# Patient Record
Sex: Female | Born: 1965 | Hispanic: Yes | Marital: Single | Smoking: Never smoker
Health system: Southern US, Community
[De-identification: ages and names within clinical notes are randomized; demographics above are authoritative.]

## PROBLEM LIST (undated history)

## (undated) DIAGNOSIS — R011 Cardiac murmur, unspecified: Secondary | ICD-10-CM

---

## 2004-10-13 ENCOUNTER — Emergency Department: Payer: Self-pay | Admitting: Emergency Medicine

## 2014-12-30 ENCOUNTER — Encounter: Payer: Self-pay | Admitting: *Deleted

## 2014-12-30 DIAGNOSIS — R42 Dizziness and giddiness: Secondary | ICD-10-CM | POA: Insufficient documentation

## 2014-12-30 DIAGNOSIS — R197 Diarrhea, unspecified: Secondary | ICD-10-CM | POA: Insufficient documentation

## 2014-12-30 DIAGNOSIS — R0602 Shortness of breath: Secondary | ICD-10-CM | POA: Insufficient documentation

## 2014-12-30 DIAGNOSIS — R1013 Epigastric pain: Secondary | ICD-10-CM | POA: Insufficient documentation

## 2014-12-30 DIAGNOSIS — R112 Nausea with vomiting, unspecified: Secondary | ICD-10-CM | POA: Insufficient documentation

## 2014-12-30 MED ORDER — ONDANSETRON 4 MG PO TBDP
4.0000 mg | ORAL_TABLET | Freq: Once | ORAL | Status: AC | PRN
  Administered 2014-12-31: 4 mg via ORAL
  Filled 2014-12-30: qty 1

## 2014-12-30 NOTE — ED Notes (Signed)
Pt c/o nausea early this morning. Pt states abdominal pain started at 1500. Pt states vomiting starting after pain and she has vomiting too many times to count. Pt states diarrhea started at 2230. Pt states abdominal pain worsening over time. Pt c/o dysuria starting today. Pt states unable to tolerate any PO.

## 2014-12-31 ENCOUNTER — Other Ambulatory Visit: Payer: Self-pay

## 2014-12-31 ENCOUNTER — Emergency Department
Admission: EM | Admit: 2014-12-31 | Discharge: 2014-12-31 | Disposition: A | Discharge status: Home / Self Care | Payer: Self-pay | Attending: Emergency Medicine | Admitting: Emergency Medicine

## 2014-12-31 DIAGNOSIS — R197 Diarrhea, unspecified: Secondary | ICD-10-CM

## 2014-12-31 DIAGNOSIS — R1013 Epigastric pain: Secondary | ICD-10-CM

## 2014-12-31 DIAGNOSIS — R112 Nausea with vomiting, unspecified: Secondary | ICD-10-CM

## 2014-12-31 HISTORY — DX: Cardiac murmur, unspecified: R01.1

## 2014-12-31 LAB — URINALYSIS COMPLETE WITH MICROSCOPIC (ARMC ONLY)
Bilirubin Urine: NEGATIVE
Glucose, UA: NEGATIVE mg/dL
HGB URINE DIPSTICK: NEGATIVE
Leukocytes, UA: NEGATIVE
Nitrite: NEGATIVE
PH: 5 (ref 5.0–8.0)
Protein, ur: 30 mg/dL — AB
Specific Gravity, Urine: 1.03 (ref 1.005–1.030)

## 2014-12-31 LAB — COMPREHENSIVE METABOLIC PANEL
ALT: 26 U/L (ref 14–54)
AST: 26 U/L (ref 15–41)
Albumin: 4.7 g/dL (ref 3.5–5.0)
Alkaline Phosphatase: 73 U/L (ref 38–126)
Anion gap: 10 (ref 5–15)
BUN: 14 mg/dL (ref 6–20)
CALCIUM: 9.7 mg/dL (ref 8.9–10.3)
CO2: 27 mmol/L (ref 22–32)
CREATININE: 0.62 mg/dL (ref 0.44–1.00)
Chloride: 102 mmol/L (ref 101–111)
GFR calc Af Amer: >60 mL/min (ref >60)
Glucose, Bld: 142 mg/dL — ABNORMAL HIGH (ref 65–99)
POTASSIUM: 4.2 mmol/L (ref 3.5–5.1)
Sodium: 139 mmol/L (ref 135–145)
TOTAL PROTEIN: 8.8 g/dL — AB (ref 6.5–8.1)
Total Bilirubin: 0.4 mg/dL (ref 0.3–1.2)

## 2014-12-31 LAB — LIPASE, BLOOD: Lipase: 21 U/L — ABNORMAL LOW (ref 22–51)

## 2014-12-31 LAB — CBC
HEMATOCRIT: 44.9 % (ref 35.0–47.0)
HEMOGLOBIN: 15.2 g/dL (ref 12.0–16.0)
MCH: 29.3 pg (ref 26.0–34.0)
MCHC: 33.8 g/dL (ref 32.0–36.0)
MCV: 86.9 fL (ref 80.0–100.0)
PLATELETS: 264 10*3/uL (ref 150–440)
RBC: 5.17 MIL/uL (ref 3.80–5.20)
RDW: 14 % (ref 11.5–14.5)
WBC: 16.6 10*3/uL — AB (ref 3.6–11.0)

## 2014-12-31 LAB — TROPONIN I

## 2014-12-31 MED ORDER — SODIUM CHLORIDE 0.9 % IV SOLN
1000.0000 mL | Freq: Once | INTRAVENOUS | Status: AC
  Administered 2014-12-31: 1000 mL via INTRAVENOUS

## 2014-12-31 MED ORDER — METOCLOPRAMIDE HCL 10 MG PO TABS: 10.0000 mg | ORAL_TABLET | Freq: Three times a day (TID) | ORAL | Status: AC

## 2014-12-31 NOTE — ED Provider Notes (Signed)
[facility] Emergency Department Provider Note  ____________________________________________  Time seen: Approximately 510 AM  I have reviewed the triage vital signs and the nursing notes.   HISTORY  Chief Complaint Abdominal Pain and Emesis    HPI Leah Farrell is a 49 y.o. female who comes in with abdominal pain and vomiting. The patient reports that initially she felt nauseous then started having vomiting and then started having diarrhea. The patient reports that this started this morning with the nausea and the dizziness. She reports that after 4 she started vomiting and then had diarrhea. She reports that she did eat a chicken burger that she does not think was cooked all the way through. She reports that after eating it she thinks that she started feeling nauseous. The patient reports that the pain was in her upper abdomen and her stomach felt hard. The patient reports that when she vomited it was only phlegm in her diarrhea was very watery. Currently the patient is 0 out of 10 pain.    Past Medical History  Diagnosis Date  . Heart murmur     There are no active problems to display for this patient.   History reviewed. No pertinent past surgical history.  Current Outpatient Rx  Name  Route  Sig  Dispense  Refill  . metoCLOPramide (REGLAN) 10 MG tablet   Oral   Take 1 tablet (10 mg total) by mouth 3 (three) times daily with meals.   20 tablet   0     Allergies Pork-derived products and Sardine oil  History reviewed. No pertinent family history.  Social History History  Substance Use Topics  . Smoking status: Never Smoker   . Smokeless tobacco: Never Used  . Alcohol Use: No    Review of Systems Constitutional: sweats, No fever/chills Eyes: No visual changes. ENT: No sore throat. Cardiovascular: Denies chest pain. Respiratory: shortness of breath. Gastrointestinal: Abdominal pain, nausea, vomiting, diarrhea Genitourinary: Negative  for dysuria. Musculoskeletal: Negative for back pain. Skin: Negative for rash. Neurological: Dizziness  10-point ROS otherwise negative.  ____________________________________________   PHYSICAL EXAM:  VITAL SIGNS: ED Triage Vitals  Enc Vitals Group     BP 12/30/14 2335 109/40 mmHg     Pulse Rate 12/30/14 2335 73     Resp 12/30/14 2335 22     Temp 12/30/14 2335 98.1 F (36.7 C)     Temp Source 12/30/14 2335 Oral     SpO2 12/30/14 2335 97 %     Weight 12/30/14 2335 200 lb (90.719 kg)     Height 12/30/14 2335 5\' 5"  (1.651 m)     Head Cir --      Peak Flow --      Pain Score 12/30/14 2337 1     Pain Loc --      Pain Edu? --      Excl. in GC? --     Constitutional: Alert and oriented. Well appearing and in no acute distress. Eyes: Conjunctivae are normal. PERRL. EOMI. Head: Atraumatic. Nose: No congestion/rhinnorhea. Mouth/Throat: Mucous membranes are moist.  Oropharynx non-erythematous. Cardiovascular: Normal rate, regular rhythm. Grossly normal heart sounds.  Good peripheral circulation. Respiratory: Normal respiratory effort.  No retractions. Lungs CTAB. Gastrointestinal: Soft and nontender. No distention. Positive bowel sounds Genitourinary: Deferred Musculoskeletal: No lower extremity tenderness nor edema.  No joint effusions. Neurologic:  Normal speech and language.  Skin:  Skin is warm, dry and intact. No rash noted. Psychiatric: Mood and affect are normal.  ____________________________________________   LABS (all labs ordered are listed, but only abnormal results are displayed)  Labs Reviewed  LIPASE, BLOOD - Abnormal; Notable for the following:    Lipase 21 (*)    All other components within normal limits  COMPREHENSIVE METABOLIC PANEL - Abnormal; Notable for the following:    Glucose, Bld 142 (*)    Total Protein 8.8 (*)    All other components within normal limits  CBC - Abnormal; Notable for the following:    WBC 16.6 (*)    All other components  within normal limits  URINALYSIS COMPLETEWITH MICROSCOPIC ([facility] ONLY) - Abnormal; Notable for the following:    Color, Urine YELLOW (*)    APPearance HAZY (*)    Ketones, ur TRACE (*)    Protein, ur 30 (*)    Bacteria, UA RARE (*)    Squamous Epithelial / LPF 6-30 (*)    All other components within normal limits  TROPONIN I   ____________________________________________  EKG  ED ECG REPORT I, [name], the attending physician, personally viewed and interpreted this ECG.   Date: 12/31/2014  EKG Time: 750  Rate: 56  Rhythm: sinus bradycardia  Axis: normal  Intervals:none  ST&T Change: none  ____________________________________________  RADIOLOGY  None ____________________________________________   PROCEDURES  Procedure(s) performed: None  Critical Care performed: No  ____________________________________________   INITIAL IMPRESSION / ASSESSMENT AND PLAN / ED COURSE  Pertinent labs & imaging results that were available during my care of the patient were reviewed by me and considered in my medical decision making (see chart for details).  This is a 49 year old female who comes in with nausea vomiting and diarrhea as well as abdominal pain. The patient reports that the symptoms have resolved at this time. She did receive a liter of normal saline as well as some Zofran when she arrived in the hospital. The patient's initial blood work appeared normal but I will do it EKG and the troponin. The patient was able to take some fluids without any further vomiting or worsening of her pain. It is possible that the patient does have some GI bug which causes her symptoms. I will reassess the patient when she's received the other blood work.  The patient's upon her EKG is unremarkable. I will discharge the patient home as she has been able to tolerate by mouth she is no longer having any pain or vomiting. The patient will be discharged to follow-up with her primary care  physician. ____________________________________________   FINAL CLINICAL IMPRESSION(S) / ED DIAGNOSES  Final diagnoses:  Nausea and vomiting, vomiting of unspecified type  Diarrhea  Epigastric pain      [name], MD 12/31/14 [phone]

## 2014-12-31 NOTE — ED Notes (Signed)
Pt upto restroom, no signs of distress

## 2014-12-31 NOTE — Discharge Instructions (Signed)
Dolor abdominal °(Abdominal Pain) °El dolor puede tener muchas causas. Normalmente la causa del dolor abdominal no es una enfermedad y mejorará sin tratamiento. Frecuentemente puede controlarse y tratarse en casa. Su médico le realizará un examen físico y posiblemente solicite análisis de sangre y radiografías para ayudar a determinar la gravedad de su dolor. Sin embargo, en muchos casos, debe transcurrir más tiempo antes de que se pueda encontrar una causa evidente del dolor. Antes de llegar a ese punto, es posible que su médico no sepa si necesita más pruebas o un tratamiento más profundo. °INSTRUCCIONES PARA EL CUIDADO EN EL HOGAR  °Esté atento al dolor para ver si hay cambios. Las siguientes indicaciones ayudarán a aliviar cualquier molestia que pueda sentir: °· Tome solo medicamentos de venta libre o recetados, según las indicaciones del médico. °· No tome laxantes a menos que se lo haya indicado su médico. °· Pruebe con una dieta líquida absoluta (caldo, té o agua) según se lo indique su médico. Introduzca gradualmente una dieta normal, según su tolerancia. °SOLICITE ATENCIÓN MÉDICA SI: °· Tiene dolor abdominal sin explicación. °· Tiene dolor abdominal relacionado con náuseas o diarrea. °· Tiene dolor cuando orina o defeca. °· Experimenta dolor abdominal que lo despierta de noche. °· Tiene dolor abdominal que empeora o mejora cuando come alimentos. °· Tiene dolor abdominal que empeora cuando come alimentos grasosos. °· Tiene fiebre. °SOLICITE ATENCIÓN MÉDICA DE INMEDIATO SI:  °· El dolor no desaparece en un plazo máximo de 2 horas. °· No deja de (vomitar). °· El dolor se siente solo en partes del abdomen, como el lado derecho o la parte inferior izquierda del abdomen. °· Evacúa materia fecal sanguinolenta o negra, de aspecto alquitranado. °ASEGÚRESE DE QUE: °· Comprende estas instrucciones. °· Controlará su afección. °· Recibirá ayuda de inmediato si no mejora o si empeora. °Document Released: 05/13/2005  Document Revised: 05/18/2013 °ExitCare® Patient Information ©2015 [employer]. This information is not intended to replace advice given to you by your health care provider. Make sure you discuss any questions you have with your health care provider. ° °Náuseas y Vómitos °(Nausea and Vomiting) °La náusea es la sensación de malestar en el estómago o de la necesidad de vomitar. El vómito es un reflejo por el que los contenidos del estómago salen por la boca. El vómito puede ocasionar pérdida de líquidos del organismo (deshidratación). Los niños y los adultos mayores pueden deshidratarse rápidamente (en especial si también tienen diarrea). Las náuseas y los vómitos son síntoma de un trastorno o enfermedad. Es importante averiguar la causa de los síntomas. °CAUSAS °· Irritación directa de la membrana que cubre el estómago. Esta irritación puede ser resultado del aumento de la producción de ácido, (reflujo gastroesofágico), infecciones, intoxicación alimentaria, ciertos medicamentos (como antinflamatorios no esteroideos), consumo de alcohol o de tabaco. °· Señales del cerebro. Estas señales pueden ser un dolor de cabeza, exposición al calor, trastornos del oído interno, aumento de la presión en el cerebro por lesiones, infección, un tumor o conmoción cerebral, estímulos emocionales o problemas metabólicos. °· Una obstrucción en el tracto gastrointestinal (obstrucción intestinal). °· Ciertas enfermedades como la diabetes, problemas en la vesícula biliar, apendicitis, problemas renales, cáncer, sepsis, síntomas atípicos de infarto o trastornos alimentarios. °· Tratamientos médicos como la quimioterapia y la radiación. °· Medicamentos que inducen al sueño (anestesia general) durante una cirugía. °DIAGNÓSTICO  °El médico podrá solicitarle algunos análisis si los problemas no mejoran luego de algunos días. También podrán pedirle análisis si los síntomas son graves o si el motivo de los   vómitos o las náuseas no está claro.  Los análisis pueden ser:  °· Análisis de orina. °· Análisis de sangre. °· Pruebas de materia fecal. °· Cultivos (para buscar evidencias de infección). °· Radiografías u otros estudios por imágenes. °Los resultados de las pruebas lo ayudarán al médico a tomar decisiones acerca del mejor curso de tratamiento o la necesidad de análisis adicionales.  °TRATAMIENTO  °Debe estar bien hidratado. Beba con frecuencia pequeñas cantidades de líquido. Puede beber agua, bebidas deportivas, caldos claros o comer pequeños trocitos de hielo o gelatina para mantenerse hidratado. Cuando coma, hágalo lentamente para evitar las náuseas. Hay medicamentos para evitar las náuseas que pueden aliviarlo.  °INSTRUCCIONES PARA EL CUIDADO DOMICILIARIO °· Si su médico le prescribe medicamentos tómelos como se le haya indicado. °· Si no tiene hambre, no se fuerce a comer. Sin embargo, es necesario que tome líquidos. °· Si tiene hambre aliméntese con una dieta normal, a menos que el médico le indique otra cosa. °· Los mejores alimentos son una combinación de carbohidratos complejos (arroz, trigo, papas, pan), carnes magras, yogur, frutas y vegetales. °· Evite los alimentos ricos en grasas porque dificultan la digestión. °· Beba gran cantidad de líquido para mantener la orina de tono claro o color amarillo pálido. °· Si está deshidratado, consulte a su médico para que le dé instrucciones específicas para volver a hidratarlo. Los signos de deshidratación son: °· Mucha sed. °· Labios y boca secos. °· Mareos. °· Orina oscura. °· Disminución de la frecuencia y cantidad de la orina. °· Confusión. °· Tiene el pulso o la respiración acelerados. °SOLICITE ATENCIÓN MÉDICA DE INMEDIATO SI: °· Vomita sangre o algo similar a la borra del café. °· La materia fecal (heces) es negra o tiene sangre. °· Sufre una cefalea grave o rigidez en el cuello. °· Se siente confundido. °· Siente dolor abdominal intenso. °· Tiene dolor en el pecho o dificultad para  respirar. °· No orina por 8 horas. °· Tiene la piel fría y pegajosa. °· Sigue vomitando durante más de 24 a 48 horas. °· Tiene fiebre. °ASEGÚRESE QUE:  °· Comprende estas instrucciones. °· Controlará su enfermedad. °· Solicitará ayuda inmediatamente si no mejora o si empeora. °Document Released: 06/02/2007 Document Revised: 08/05/2011 °ExitCare® Patient Information ©2015 [employer]. This information is not intended to replace advice given to you by your health care provider. Make sure you discuss any questions you have with your health care provider. ° °

## 2014-12-31 NOTE — ED Notes (Signed)
Pt provided with water for po challenge. Call bell at side, emesis bag provided "just in case".

## 2014-12-31 NOTE — ED Notes (Addendum)
Lab informed of add on troponin, EKG performed

## 2017-12-10 ENCOUNTER — Emergency Department
Admission: EM | Admit: 2017-12-10 | Discharge: 2017-12-10 | Disposition: A | Discharge status: Home / Self Care | Payer: Self-pay | Attending: Student in an Organized Health Care Education/Training Program | Admitting: Student in an Organized Health Care Education/Training Program

## 2017-12-10 ENCOUNTER — Emergency Department: Payer: Self-pay

## 2017-12-10 ENCOUNTER — Other Ambulatory Visit: Payer: Self-pay

## 2017-12-10 ENCOUNTER — Encounter: Payer: Self-pay | Admitting: *Deleted

## 2017-12-10 DIAGNOSIS — K529 Noninfective gastroenteritis and colitis, unspecified: Secondary | ICD-10-CM | POA: Insufficient documentation

## 2017-12-10 DIAGNOSIS — R197 Diarrhea, unspecified: Secondary | ICD-10-CM

## 2017-12-10 LAB — URINALYSIS, COMPLETE (UACMP) WITH MICROSCOPIC
BACTERIA UA: NONE SEEN
BILIRUBIN URINE: NEGATIVE
Glucose, UA: NEGATIVE mg/dL
Leukocytes, UA: NEGATIVE
Nitrite: NEGATIVE
Protein, ur: NEGATIVE mg/dL
pH: 5.5 (ref 5.0–8.0)

## 2017-12-10 LAB — COMPREHENSIVE METABOLIC PANEL
ALT: 44 U/L (ref 0–44)
AST: 34 U/L (ref 15–41)
Albumin: 4 g/dL (ref 3.5–5.0)
Alkaline Phosphatase: 70 U/L (ref 38–126)
Anion gap: 9 (ref 5–15)
BUN: 12 mg/dL (ref 6–20)
CHLORIDE: 105 mmol/L (ref 98–111)
CO2: 26 mmol/L (ref 22–32)
Calcium: 9.1 mg/dL (ref 8.9–10.3)
Creatinine, Ser: 0.65 mg/dL (ref 0.44–1.00)
GFR calc non Af Amer: >60 mL/min (ref >60)
Glucose, Bld: 123 mg/dL — ABNORMAL HIGH (ref 70–99)
Potassium: 3.6 mmol/L (ref 3.5–5.1)
Sodium: 140 mmol/L (ref 135–145)
Total Bilirubin: 0.6 mg/dL (ref 0.3–1.2)
Total Protein: 8.1 g/dL (ref 6.5–8.1)

## 2017-12-10 LAB — CBC
HEMATOCRIT: 41.5 % (ref 35.0–47.0)
Hemoglobin: 14.2 g/dL (ref 12.0–16.0)
MCH: 30 pg (ref 26.0–34.0)
MCHC: 34.2 g/dL (ref 32.0–36.0)
MCV: 87.8 fL (ref 80.0–100.0)
Platelets: 271 10*3/uL (ref 150–440)
RBC: 4.73 MIL/uL (ref 3.80–5.20)
RDW: 13.9 % (ref 11.5–14.5)
WBC: 15.6 10*3/uL — ABNORMAL HIGH (ref 3.6–11.0)

## 2017-12-10 LAB — LIPASE, BLOOD: LIPASE: 30 U/L (ref 11–51)

## 2017-12-10 MED ORDER — TRAMADOL HCL 50 MG PO TABS: 50.0000 mg | ORAL_TABLET | Freq: Four times a day (QID) | ORAL | 0 refills | Status: AC | PRN

## 2017-12-10 MED ORDER — METOCLOPRAMIDE HCL 10 MG PO TABS: 10.0000 mg | ORAL_TABLET | Freq: Four times a day (QID) | ORAL | 0 refills | Status: AC | PRN

## 2017-12-10 MED ORDER — PROMETHAZINE HCL 25 MG/ML IJ SOLN
12.5000 mg | Freq: Four times a day (QID) | INTRAMUSCULAR | Status: DC | PRN
  Administered 2017-12-10: 12.5 mg via INTRAVENOUS
  Filled 2017-12-10: qty 1

## 2017-12-10 MED ORDER — SODIUM CHLORIDE 0.9 % IV BOLUS
1000.0000 mL | Freq: Once | INTRAVENOUS | Status: AC
  Administered 2017-12-10: 1000 mL via INTRAVENOUS

## 2017-12-10 MED ORDER — FENTANYL CITRATE (PF) 100 MCG/2ML IJ SOLN
50.0000 ug | INTRAMUSCULAR | Status: DC | PRN
  Administered 2017-12-10: 50 ug via INTRAVENOUS
  Filled 2017-12-10: qty 2

## 2017-12-10 MED ORDER — IOHEXOL 300 MG/ML  SOLN
100.0000 mL | Freq: Once | INTRAMUSCULAR | Status: AC | PRN
  Administered 2017-12-10: 100 mL via INTRAVENOUS

## 2017-12-10 MED ORDER — ACETAMINOPHEN 325 MG PO TABS
ORAL_TABLET | ORAL | Status: AC
  Filled 2017-12-10: qty 2

## 2017-12-10 MED ORDER — AMOXICILLIN-POT CLAVULANATE 875-125 MG PO TABS
1.0000 | ORAL_TABLET | Freq: Once | ORAL | Status: AC
  Administered 2017-12-10: 1 via ORAL
  Filled 2017-12-10: qty 1

## 2017-12-10 MED ORDER — AMOXICILLIN-POT CLAVULANATE 875-125 MG PO TABS: 1.0000 | ORAL_TABLET | Freq: Two times a day (BID) | ORAL | 0 refills | Status: AC

## 2017-12-10 MED ORDER — ONDANSETRON 4 MG PO TBDP
4.0000 mg | ORAL_TABLET | Freq: Once | ORAL | Status: AC | PRN
  Administered 2017-12-10: 4 mg via ORAL
  Filled 2017-12-10: qty 1

## 2017-12-10 MED ORDER — PROBIOTIC 250 MG PO CAPS: 1.0000 | ORAL_CAPSULE | Freq: Two times a day (BID) | ORAL | 0 refills | Status: AC | PRN

## 2017-12-10 MED ORDER — ACETAMINOPHEN 325 MG PO TABS
650.0000 mg | ORAL_TABLET | Freq: Once | ORAL | Status: AC
  Administered 2017-12-10: 650 mg via ORAL

## 2017-12-10 NOTE — Discharge Instructions (Signed)

## 2017-12-10 NOTE — ED Provider Notes (Signed)
[facility] Emergency Department Provider Note    First MD Initiated Contact with Patient 12/10/17 1427     (approximate)  I have reviewed the triage vital signs and the nursing notes.   HISTORY  Chief Complaint Abdominal Pain and Diarrhea    HPI Leah Farrell is a 52 y.o. female presents the ER with chief complaint of nausea diarrhea and right-sided abdominal discomfort.  No measured fevers at home no dysuria.  No flank pain.  Feels that she had decreased oral intake due to upset stomach for the past 2 days.  Denies any chest pain or shortness of breath.  States the pain is achy and crampy in nature.    Past Medical History:  Diagnosis Date  . Heart murmur    History reviewed. No pertinent family history. History reviewed. No pertinent surgical history. There are no active problems to display for this patient.     Prior to Admission medications   Medication Sig Start Date End Date Taking? Authorizing Provider  amoxicillin-clavulanate (AUGMENTIN) 875-125 MG tablet Take 1 tablet by mouth 2 (two) times daily for 7 days. 12/10/17 12/17/17  Willy Eddy, MD  metoCLOPramide (REGLAN) 10 MG tablet Take 1 tablet (10 mg total) by mouth 3 (three) times daily with meals. 12/31/14 12/31/15  [name], MD  metoCLOPramide (REGLAN) 10 MG tablet Take 1 tablet (10 mg total) by mouth every 6 (six) hours as needed. 12/10/17 12/10/18  Willy Eddy, MD  Saccharomyces boulardii (PROBIOTIC) 250 MG CAPS Take 1 capsule by mouth 3 times/day as needed-between meals & bedtime. 12/10/17   Willy Eddy, MD  traMADol (ULTRAM) 50 MG tablet Take 1 tablet (50 mg total) by mouth every 6 (six) hours as needed. 12/10/17 12/10/18  Willy Eddy, MD    Allergies Pork-derived products and Sardine oil    Social History Social History   Tobacco Use  . Smoking status: Never Smoker  . Smokeless tobacco: Never Used  Substance Use Topics  . Alcohol use: No    . Drug use: No    Review of Systems Patient denies headaches, rhinorrhea, blurry vision, numbness, shortness of breath, chest pain, edema, cough, abdominal pain, nausea, vomiting, diarrhea, dysuria, fevers, rashes or hallucinations unless otherwise stated above in HPI. ____________________________________________   PHYSICAL EXAM:  VITAL SIGNS: Vitals:   12/10/17 1808 12/10/17 1812  BP:    Pulse:    Resp:    Temp: 99 F (37.2 C) 99.5 F (37.5 C)  SpO2:      Constitutional: Alert and oriented.  Eyes: Conjunctivae are normal.  Head: Atraumatic. Nose: No congestion/rhinnorhea. Mouth/Throat: Mucous membranes are moist.   Neck: No stridor. Painless ROM.  Cardiovascular: Normal rate, regular rhythm. Grossly normal heart sounds.  Good peripheral circulation. Respiratory: Normal respiratory effort.  No retractions. Lungs CTAB. Gastrointestinal: Soft and nontender. No distention. No abdominal bruits. No CVA tenderness. Genitourinary: deferred Musculoskeletal: No lower extremity tenderness nor edema.  No joint effusions. Neurologic:  Normal speech and language. No gross focal neurologic deficits are appreciated. No facial droop Skin:  Skin is warm, dry and intact. No rash noted. Psychiatric: Mood and affect are normal. Speech and behavior are normal.  ____________________________________________   LABS (all labs ordered are listed, but only abnormal results are displayed)  Results for orders placed or performed during the hospital encounter of 12/10/17 (from the past 24 hour(s))  Lipase, blood     Status: None   Collection Time: 12/10/17 12:16 PM  Result Value  Ref Range   Lipase 30 11 - 51 U/L  Comprehensive metabolic panel     Status: Abnormal   Collection Time: 12/10/17 12:16 PM  Result Value Ref Range   Sodium 140 135 - 145 mmol/L   Potassium 3.6 3.5 - 5.1 mmol/L   Chloride 105 98 - 111 mmol/L   CO2 26 22 - 32 mmol/L   Glucose, Bld 123 (H) 70 - 99 mg/dL   BUN 12 6 -  20 mg/dL   Creatinine, Ser 1.61 0.44 - 1.00 mg/dL   Calcium 9.1 8.9 - 09.6 mg/dL   Total Protein 8.1 6.5 - 8.1 g/dL   Albumin 4.0 3.5 - 5.0 g/dL   AST 34 15 - 41 U/L   ALT 44 0 - 44 U/L   Alkaline Phosphatase 70 38 - 126 U/L   Total Bilirubin 0.6 0.3 - 1.2 mg/dL   GFR calc non Af Amer >60 >60 mL/min   GFR calc Af Amer >60 >60 mL/min   Anion gap 9 5 - 15  CBC     Status: Abnormal   Collection Time: 12/10/17 12:16 PM  Result Value Ref Range   WBC 15.6 (H) 3.6 - 11.0 K/uL   RBC 4.73 3.80 - 5.20 MIL/uL   Hemoglobin 14.2 12.0 - 16.0 g/dL   HCT 04.5 40.9 - 81.1 %   MCV 87.8 80.0 - 100.0 fL   MCH 30.0 26.0 - 34.0 pg   MCHC 34.2 32.0 - 36.0 g/dL   RDW 91.4 78.2 - 95.6 %   Platelets 271 150 - 440 K/uL  Urinalysis, Complete w Microscopic     Status: Abnormal   Collection Time: 12/10/17  2:03 PM  Result Value Ref Range   Color, Urine YELLOW YELLOW   APPearance TURBID (A) CLEAR   Specific Gravity, Urine >1.030 (H) 1.005 - 1.030   pH 5.5 5.0 - 8.0   Glucose, UA NEGATIVE NEGATIVE mg/dL   Hgb urine dipstick TRACE (A) NEGATIVE   Bilirubin Urine NEGATIVE NEGATIVE   Ketones, ur TRACE (A) NEGATIVE mg/dL   Protein, ur NEGATIVE NEGATIVE mg/dL   Nitrite NEGATIVE NEGATIVE   Leukocytes, UA NEGATIVE NEGATIVE   Squamous Epithelial / LPF 0-5 0 - 5   WBC, UA 0-5 0 - 5 WBC/hpf   RBC / HPF 0-5 0 - 5 RBC/hpf   Bacteria, UA NONE SEEN NONE SEEN   Amorphous Crystal PRESENT    ____________________________________________  EKG____________________________________________  RADIOLOGY  I personally reviewed all radiographic images ordered to evaluate for the above acute complaints and reviewed radiology reports and findings.  These findings were personally discussed with the patient.  Please see medical record for radiology report.  ____________________________________________   PROCEDURES  Procedure(s) performed:  Procedures    Critical Care performed:  no ____________________________________________   INITIAL IMPRESSION / ASSESSMENT AND PLAN / ED COURSE  Pertinent labs & imaging results that were available during my care of the patient were reviewed by me and considered in my medical decision making (see chart for details).   DDX: Diverticular-itis, appendicitis, colitis, IBD, enteritis, abscess, mass  Leah Farrell is a 52 y.o. who presents to the ED with symptoms as described above.  Patient will be given IV fluids as she does appear dehydrated for GI losses as well as IV pain medication IV antiemetic.  CT imaging will be ordered for the above differential based on patient's abdominal pain.  Patient is afebrile Heema dynamically stable blood work is otherwise reassuring but does  have leukocytosis.  Urinalysis does show concentrated urine but no evidence of infection.  Clinical Course as of Dec 11 1843  Wed Dec 10, 2017  1719 Patient reassessed.  Pain is controlled.  Discussed results of CT imaging with patient.  She is requesting something to eat.  Will give oral antibiotics to treat for probable diverticulitis infectious colitis.   [PR]    Clinical Course User Index [PR] Willy Eddyobinson, Yasuko Lapage, MD     As part of my medical decision making, I reviewed the following data within the electronic MEDICAL RECORD NUMBER Nursing notes reviewed and incorporated, Labs reviewed, notes from prior ED visits .  ____________________________________________   FINAL CLINICAL IMPRESSION(S) / ED DIAGNOSES  Final diagnoses:  Colitis  Diarrhea of presumed infectious origin      NEW MEDICATIONS STARTED DURING THIS VISIT:  Discharge Medication List as of 12/10/2017  5:43 PM    START taking these medications   Details  amoxicillin-clavulanate (AUGMENTIN) 875-125 MG tablet Take 1 tablet by mouth 2 (two) times daily for 7 days., Starting Wed 12/10/2017, Until Wed 12/17/2017, Print    Saccharomyces boulardii (PROBIOTIC) 250 MG CAPS Take 1 capsule  by mouth 3 times/day as needed-between meals & bedtime., Starting Wed 12/10/2017, Print    traMADol (ULTRAM) 50 MG tablet Take 1 tablet (50 mg total) by mouth every 6 (six) hours as needed., Starting Wed 12/10/2017, Until Thu 12/10/2018, Print         Note:  This document was prepared using Dragon voice recognition software and may include unintentional dictation errors.    Willy Eddyobinson, Andreka Stucki, MD 12/10/17 [phone]

## 2017-12-10 NOTE — ED Triage Notes (Signed)
First RN Note: Pt c/o abdominal pain, dairrhea, and dry heaves at this time. Pt is alert and oriented, able to answer questions.

## 2017-12-10 NOTE — ED Triage Notes (Signed)
Pt to ED reporting generalized abd pain and diarrhea with nausea but no vomiting that started this morning. No medications taken at home. No fever at home. Pt denies having eaten different foods or eating out last night.

## 2017-12-10 NOTE — ED Notes (Signed)
Patient is resting comfortably. 

## 2017-12-11 ENCOUNTER — Telehealth: Payer: Self-pay | Admitting: Gastroenterology

## 2017-12-11 NOTE — Telephone Encounter (Signed)
LVM checking to see if patient would like to make a follow up appt with Dr. Allegra LaiVAnga

## 2018-07-23 ENCOUNTER — Emergency Department
Admission: EM | Admit: 2018-07-23 | Discharge: 2018-07-23 | Disposition: A | Discharge status: Home / Self Care | Payer: Self-pay | Attending: Emergency Medicine | Admitting: Emergency Medicine

## 2018-07-23 ENCOUNTER — Other Ambulatory Visit: Payer: Self-pay

## 2018-07-23 ENCOUNTER — Encounter: Payer: Self-pay | Admitting: Emergency Medicine

## 2018-07-23 DIAGNOSIS — N10 Acute tubulo-interstitial nephritis: Secondary | ICD-10-CM | POA: Insufficient documentation

## 2018-07-23 DIAGNOSIS — R531 Weakness: Secondary | ICD-10-CM | POA: Insufficient documentation

## 2018-07-23 DIAGNOSIS — R3915 Urgency of urination: Secondary | ICD-10-CM | POA: Insufficient documentation

## 2018-07-23 DIAGNOSIS — R5381 Other malaise: Secondary | ICD-10-CM | POA: Insufficient documentation

## 2018-07-23 DIAGNOSIS — R300 Dysuria: Secondary | ICD-10-CM | POA: Insufficient documentation

## 2018-07-23 LAB — COMPREHENSIVE METABOLIC PANEL
ALT: 43 U/L (ref 0–44)
ANION GAP: 10 (ref 5–15)
AST: 29 U/L (ref 15–41)
Albumin: 3.5 g/dL (ref 3.5–5.0)
Alkaline Phosphatase: 81 U/L (ref 38–126)
BUN: 18 mg/dL (ref 6–20)
CO2: 24 mmol/L (ref 22–32)
Calcium: 8.2 mg/dL — ABNORMAL LOW (ref 8.9–10.3)
Chloride: 103 mmol/L (ref 98–111)
Creatinine, Ser: 1.11 mg/dL — ABNORMAL HIGH (ref 0.44–1.00)
GFR calc Af Amer: >60 mL/min (ref >60)
GFR calc non Af Amer: 57 mL/min — ABNORMAL LOW (ref >60)
Glucose, Bld: 131 mg/dL — ABNORMAL HIGH (ref 70–99)
Potassium: 3.6 mmol/L (ref 3.5–5.1)
Sodium: 137 mmol/L (ref 135–145)
Total Bilirubin: 0.8 mg/dL (ref 0.3–1.2)
Total Protein: 7.6 g/dL (ref 6.5–8.1)

## 2018-07-23 LAB — URINALYSIS, COMPLETE (UACMP) WITH MICROSCOPIC
Bilirubin Urine: NEGATIVE
Glucose, UA: NEGATIVE mg/dL
Ketones, ur: 5 mg/dL — AB
Nitrite: NEGATIVE
Protein, ur: 100 mg/dL — AB
Specific Gravity, Urine: 1.025 (ref 1.005–1.030)
pH: 5 (ref 5.0–8.0)

## 2018-07-23 LAB — CBC
HCT: 39.4 % (ref 36.0–46.0)
Hemoglobin: 13.2 g/dL (ref 12.0–15.0)
MCH: 29.8 pg (ref 26.0–34.0)
MCHC: 33.5 g/dL (ref 30.0–36.0)
MCV: 88.9 fL (ref 80.0–100.0)
Platelets: 249 10*3/uL (ref 150–400)
RBC: 4.43 MIL/uL (ref 3.87–5.11)
RDW: 13.7 % (ref 11.5–15.5)
WBC: 19.9 10*3/uL — ABNORMAL HIGH (ref 4.0–10.5)
nRBC: 0 % (ref 0.0–0.2)

## 2018-07-23 MED ORDER — KETOROLAC TROMETHAMINE 30 MG/ML IJ SOLN
30.0000 mg | Freq: Once | INTRAMUSCULAR | Status: AC
  Administered 2018-07-23: 30 mg via INTRAVENOUS
  Filled 2018-07-23: qty 1

## 2018-07-23 MED ORDER — SODIUM CHLORIDE 0.9 % IV SOLN
1.0000 g | Freq: Once | INTRAVENOUS | Status: AC
  Administered 2018-07-23: 1 g via INTRAVENOUS
  Filled 2018-07-23: qty 10

## 2018-07-23 MED ORDER — CEPHALEXIN 500 MG PO CAPS: 500.0000 mg | ORAL_CAPSULE | Freq: Two times a day (BID) | ORAL | 0 refills | Status: AC

## 2018-07-23 MED ORDER — IBUPROFEN 600 MG PO TABS: 600.0000 mg | ORAL_TABLET | Freq: Four times a day (QID) | ORAL | 0 refills | Status: AC | PRN

## 2018-07-23 MED ORDER — SODIUM CHLORIDE 0.9 % IV BOLUS
1000.0000 mL | Freq: Once | INTRAVENOUS | Status: AC
  Administered 2018-07-23: 1000 mL via INTRAVENOUS

## 2018-07-23 NOTE — Discharge Instructions (Signed)
Return to the ER for new, worsening, persistent pain, fever, vomiting, or any other new or worsening symptoms that concern you.

## 2018-07-23 NOTE — ED Notes (Signed)
Pt asked at this time to submit UA  asap .

## 2018-07-23 NOTE — ED Provider Notes (Signed)
[facility] Emergency Department Provider Note ____________________________________________   First MD Initiated Contact with Patient 07/23/18 [phone]     (approximate)  I have reviewed the triage vital signs and the nursing notes.   HISTORY  Chief Complaint Hypotension and Back Pain    HPI Taiya Bogus is a 53 y.o. female with no significant past medical history who presents with body aches over the last several days, gradual onset, worse in her back and hips, and bilateral.  This is been associated with generalized malaise and weakness, as well as with dysuria and increased urinary urgency.  The patient denies associated fever, vomiting or diarrhea, URI symptoms, or any chest pain or shortness of breath.  She went to the outpatient office today and was referred to the emergency department due to low blood pressure.  Past Medical History:  Diagnosis Date  . Heart murmur     There are no active problems to display for this patient.   History reviewed. No pertinent surgical history.  Prior to Admission medications   Medication Sig Start Date End Date Taking? Authorizing Provider  cephALEXin (KEFLEX) 500 MG capsule Take 1 capsule (500 mg total) by mouth 2 (two) times daily for 10 days. 07/23/18 08/02/18  Dionne Bucy, MD  ibuprofen (ADVIL,MOTRIN) 600 MG tablet Take 1 tablet (600 mg total) by mouth every 6 (six) hours as needed. 07/23/18   Dionne Bucy, MD  metoCLOPramide (REGLAN) 10 MG tablet Take 1 tablet (10 mg total) by mouth 3 (three) times daily with meals. 12/31/14 12/31/15  [name], MD  metoCLOPramide (REGLAN) 10 MG tablet Take 1 tablet (10 mg total) by mouth every 6 (six) hours as needed. 12/10/17 12/10/18  Willy Eddy, MD  Saccharomyces boulardii (PROBIOTIC) 250 MG CAPS Take 1 capsule by mouth 3 times/day as needed-between meals & bedtime. 12/10/17   Willy Eddy, MD  traMADol (ULTRAM) 50 MG tablet Take 1 tablet (50  mg total) by mouth every 6 (six) hours as needed. 12/10/17 12/10/18  Willy Eddy, MD    Allergies Pork-derived products and Sardine oil  No family history on file.  Social History Social History   Tobacco Use  . Smoking status: Never Smoker  . Smokeless tobacco: Never Used  Substance Use Topics  . Alcohol use: No  . Drug use: No    Review of Systems  Constitutional: Positive for malaise. Eyes: No redness. ENT: No sore throat.  No nasal congestion. Cardiovascular: Denies chest pain. Respiratory: Denies shortness of breath. Gastrointestinal: No vomiting or diarrhea.  Genitourinary: Positive for dysuria.  Musculoskeletal: Positive for back pain. Skin: Negative for rash. Neurological: Negative for headache.   ____________________________________________   PHYSICAL EXAM:  VITAL SIGNS: ED Triage Vitals  Enc Vitals Group     BP 07/23/18 1236 (!) 97/57     Pulse Rate 07/23/18 1236 89     Resp 07/23/18 1748 18     Temp 07/23/18 1236 98.3 F (36.8 C)     Temp Source 07/23/18 1236 Oral     SpO2 07/23/18 1236 95 %     Weight 07/23/18 1245 180 lb (81.6 kg)     Height 07/23/18 1245 5\' 2"  (1.575 m)     Head Circumference --      Peak Flow --      Pain Score 07/23/18 1245 7     Pain Loc --      Pain Edu? --      Excl. in GC? --  Constitutional: Alert and oriented.  Relatively well appearing and in no acute distress. Eyes: Conjunctivae are normal.  Head: Atraumatic. Nose: No congestion/rhinnorhea. Mouth/Throat: Mucous membranes are slightly dry.   Neck: Normal range of motion.  Cardiovascular: Normal rate, regular rhythm. Grossly normal heart sounds.  Good peripheral circulation. Respiratory: Normal respiratory effort.  No retractions. Lungs CTAB. Gastrointestinal: Soft and nontender. No distention.  Genitourinary: No CVA or flank tenderness. Musculoskeletal: Extremities warm and well perfused.  Neurologic:  Normal speech and language. No gross focal  neurologic deficits are appreciated.  Skin:  Skin is warm and dry. No rash noted. Psychiatric: Mood and affect are normal. Speech and behavior are normal.  ____________________________________________   LABS (all labs ordered are listed, but only abnormal results are displayed)  Labs Reviewed  COMPREHENSIVE METABOLIC PANEL - Abnormal; Notable for the following components:      Result Value   Glucose, Bld 131 (*)    Creatinine, Ser 1.11 (*)    Calcium 8.2 (*)    GFR calc non Af Amer 57 (*)    All other components within normal limits  CBC - Abnormal; Notable for the following components:   WBC 19.9 (*)    All other components within normal limits  URINALYSIS, COMPLETE (UACMP) WITH MICROSCOPIC - Abnormal; Notable for the following components:   Color, Urine AMBER (*)    APPearance CLOUDY (*)    Hgb urine dipstick MODERATE (*)    Ketones, ur 5 (*)    Protein, ur 100 (*)    Leukocytes,Ua TRACE (*)    Bacteria, UA FEW (*)    Non Squamous Epithelial PRESENT (*)    All other components within normal limits   ____________________________________________  EKG   ____________________________________________  RADIOLOGY    ____________________________________________   PROCEDURES  Procedure(s) performed: No  Procedures  Critical Care performed: No ____________________________________________   INITIAL IMPRESSION / ASSESSMENT AND PLAN / ED COURSE  Pertinent labs & imaging results that were available during my care of the patient were reviewed by me and considered in my medical decision making (see chart for details).  53 year old female with no significant PMH presents with body aches and malaise over the last several days.  She went to the outpatient clinic today and was sent to the ED due to borderline low blood pressure.  She also reports some urinary symptoms recently.  On exam the patient is overall well-appearing and her vital signs here in the ED are normal.   The remainder of the exam is as described above.  I reviewed the past medical records in Epic.  The patient has had no recent ED visits or admissions.  She had negative influenza a and B today.  Her UA, however, is consistent with possible UTI.  Overall her presentation is most consistent with UTI/pyelonephritis.  The patient has no respiratory symptoms and is negative for influenza.  Basic labs obtained from triage show elevated WBC count but otherwise within normal limits.  I suspect that the low blood pressure is due to mild hypovolemia.  The patient has no clinical evidence of sepsis and appears well perfused with improved blood pressure at this time.  We will give fluids, Toradol for symptomatic treatment, ceftriaxone to treat UTI and reassess.  Anticipate discharge home with outpatient antibiotics.  ----------------------------------------- 10:58 PM on 07/23/2018 -----------------------------------------  Patient is feeling better after fluids and Toradol.  She is tolerating p.o. and was able to eat a meal.  She is stable for discharge home at  this time with outpatient treatment for likely early pyelonephritis.  I counseled her on the results of the work-up and the plan of care.  Return precautions given, and she expresses understanding. ____________________________________________   FINAL CLINICAL IMPRESSION(S) / ED DIAGNOSES  Final diagnoses:  Acute pyelonephritis      NEW MEDICATIONS STARTED DURING THIS VISIT:  New Prescriptions   CEPHALEXIN (KEFLEX) 500 MG CAPSULE    Take 1 capsule (500 mg total) by mouth 2 (two) times daily for 10 days.   IBUPROFEN (ADVIL,MOTRIN) 600 MG TABLET    Take 1 tablet (600 mg total) by mouth every 6 (six) hours as needed.     Note:  This document was prepared using Dragon voice recognition software and may include unintentional dictation errors.    Dionne Bucy, MD 07/23/18 2259

## 2018-07-23 NOTE — ED Triage Notes (Signed)
Pt arrives from [facility] with concerns over hypotension. Pt asked why she went to [facility] pt states "my bones hurt." Pt asked which bones and she states her back.

## 2019-07-07 IMAGING — CT CT ABD-PELV W/ CM
2 of 5 series · 16 of 46 positions shown, 18 images · IV contrast (omnipaque)
Comparison: None.

CLINICAL DATA: Nausea, vomiting and abdominal pain

EXAM:
CT ABDOMEN AND PELVIS WITH CONTRAST
TECHNIQUE: Multidetector CT imaging of the abdomen and pelvis was performed
using the standard protocol following bolus administration of
intravenous contrast.
CONTRAST:  100mL OMNIPAQUE IOHEXOL 300 MG/ML  SOLN

[Series 2: routine abd/pel with · axial · 0.66mm/px · z∈[-1095,-680]mm · 13 of 93 slices shown, 15 images]
[im 5/93  soft-tissue]
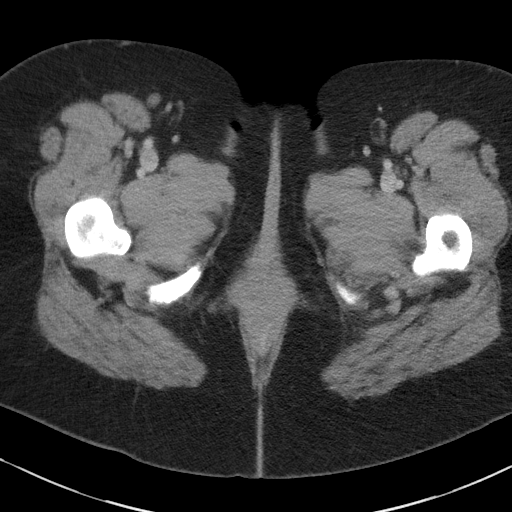
[im 5/93  bone]
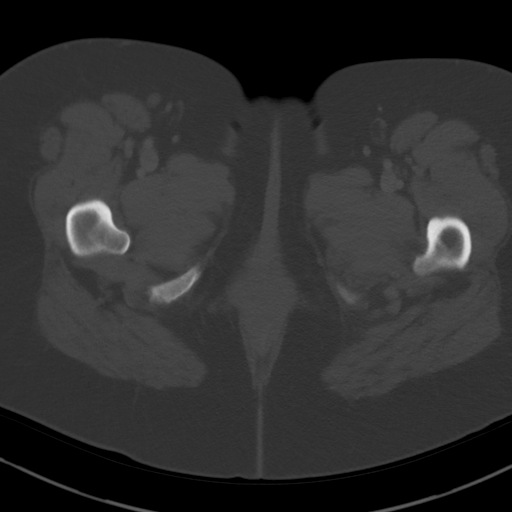
[im 15/93  soft-tissue]
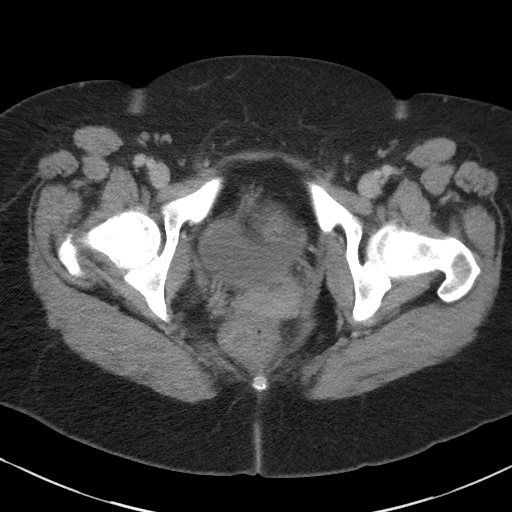
[im 20/93  soft-tissue]
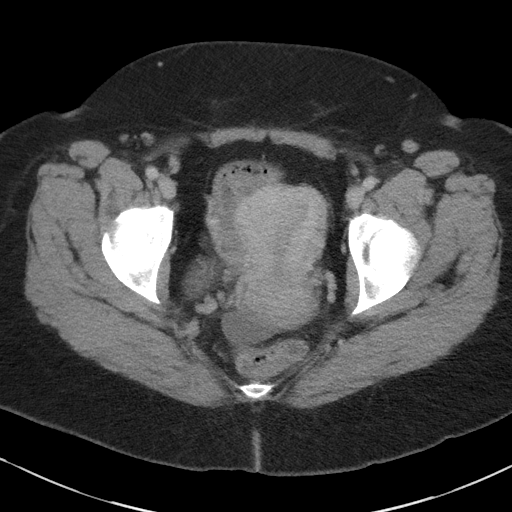
[im 25/93  soft-tissue]
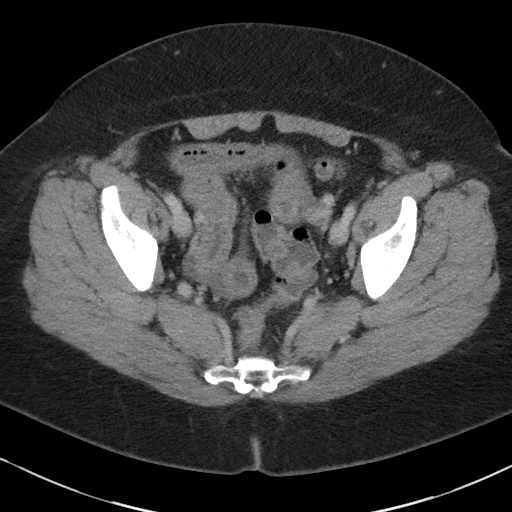
[im 34/93  soft-tissue]
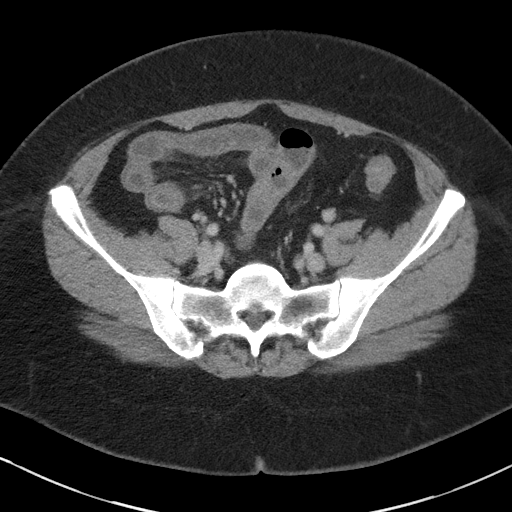
[im 39/93  soft-tissue]
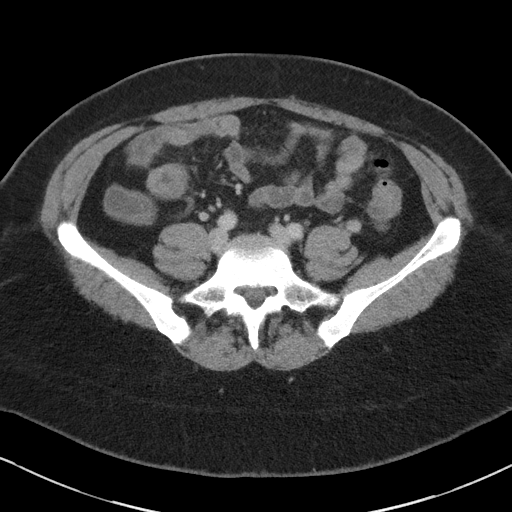
[im 49/93  soft-tissue]
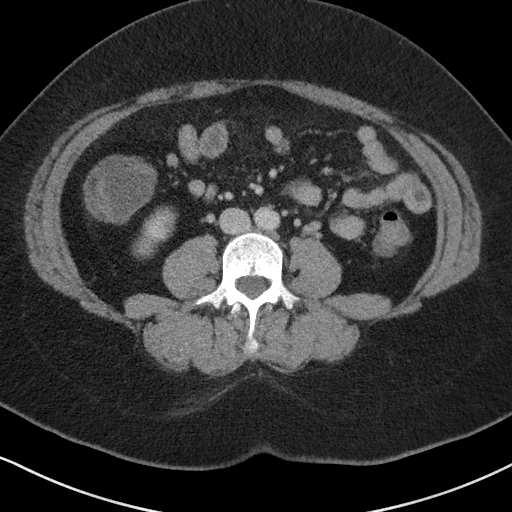
[im 54/93  soft-tissue]
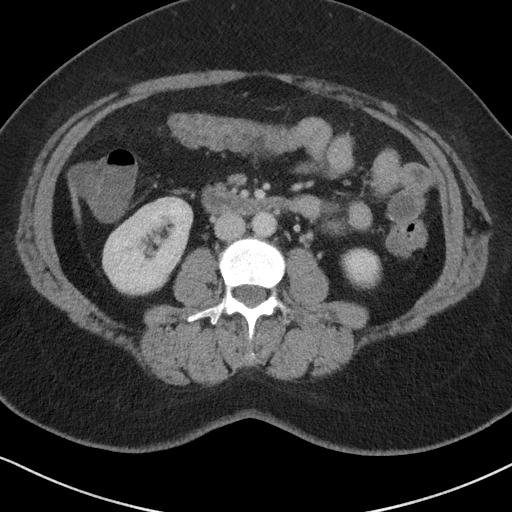
[im 59/93  soft-tissue]
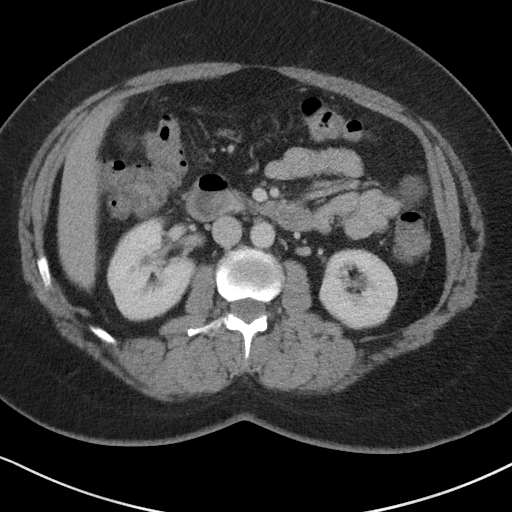
[im 59/93  bone]
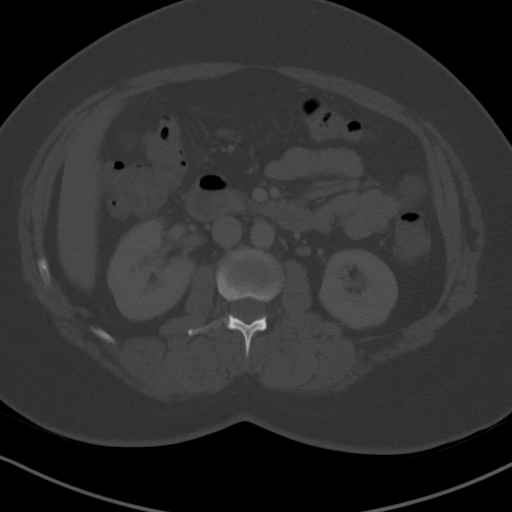
[im 68/93  soft-tissue]
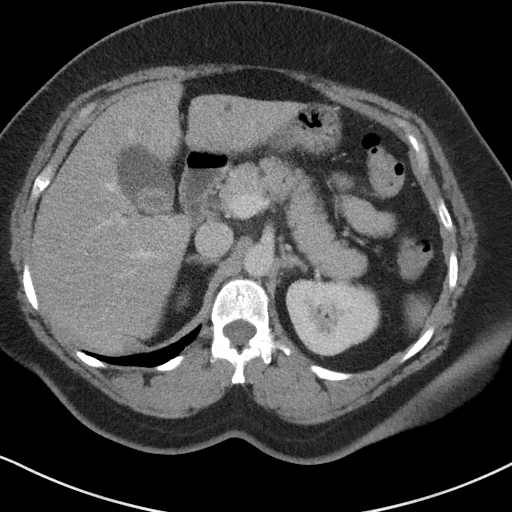
[im 73/93  soft-tissue]
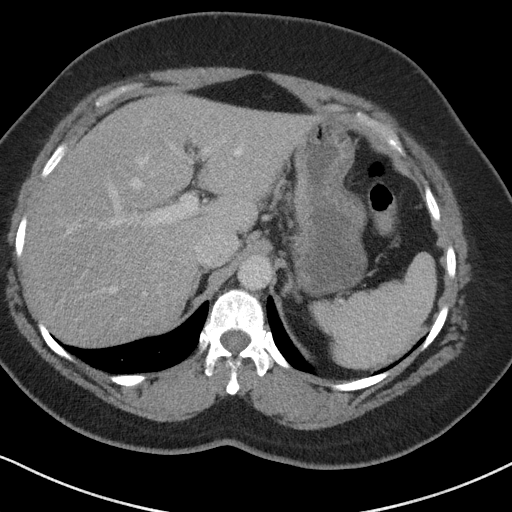
[im 78/93  soft-tissue]
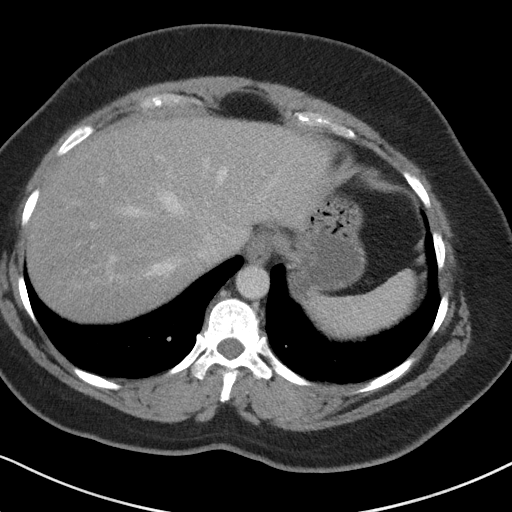
[im 88/93  soft-tissue]
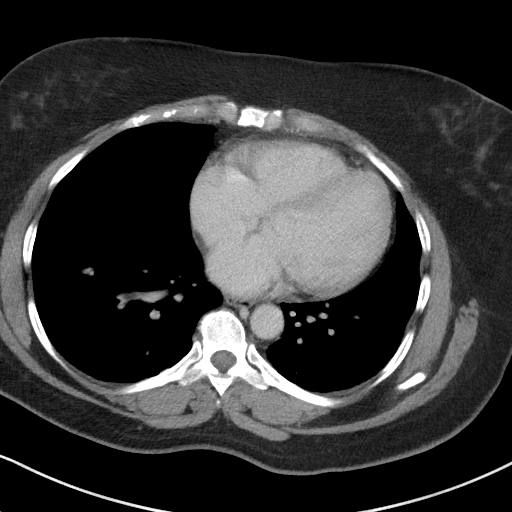

[Series 6: coronal st · coronal · 0.77mm/px · 3 of 103 slices shown]
[im 35/103  soft-tissue]
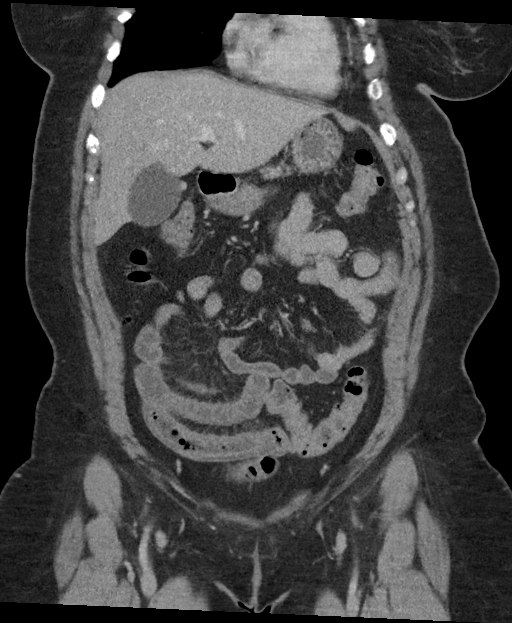
[im 46/103  soft-tissue]
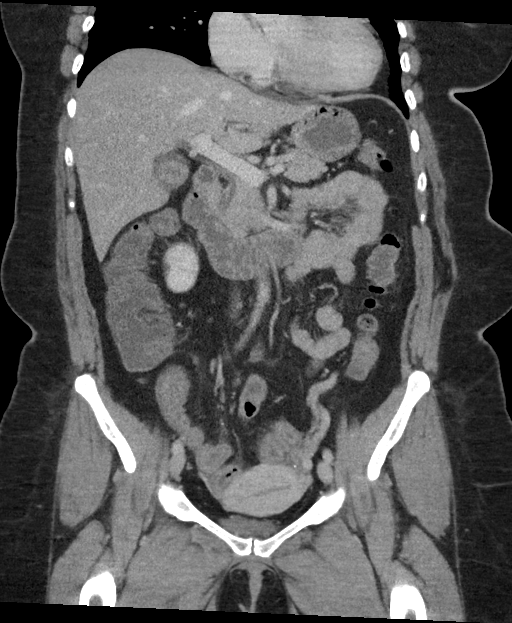
[im 57/103  soft-tissue]
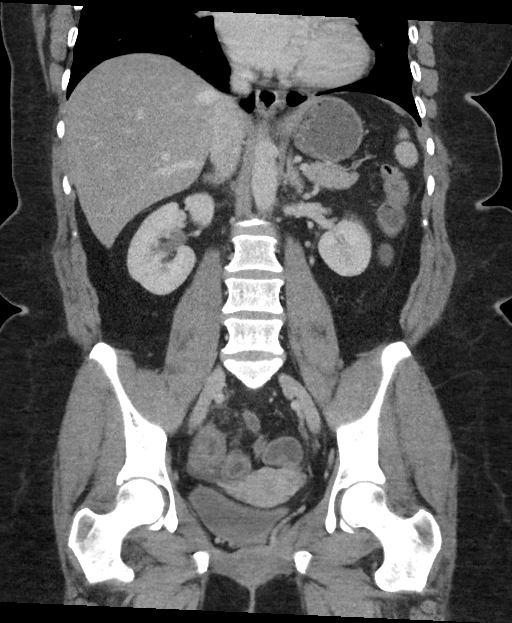

[16 of 46 positions shown; findings below may reference images not displayed]

FINDINGS: LOWER CHEST: No basilar pulmonary nodules or pleural effusion. No
apical pericardial effusion.

HEPATOBILIARY: Normal hepatic contours and density. No intra- or
extrahepatic biliary dilatation. Cholelithiasis without acute
inflammation.

PANCREAS: Normal parenchymal contours without ductal dilatation. No
peripancreatic fluid collection.

SPLEEN: Normal.

ADRENALS/URINARY TRACT:

--Adrenal glands: Normal.

--Right kidney/ureter: No hydronephrosis, nephroureterolithiasis,
perinephric stranding or solid renal mass.

--Left kidney/ureter: No hydronephrosis, nephroureterolithiasis,
perinephric stranding or solid renal mass.

--Urinary bladder: Normal for degree of distention

STOMACH/BOWEL:

--Stomach/Duodenum: No hiatal hernia or other gastric abnormality.
Normal duodenal course.

--Small bowel: There is mild wall thickening and hyperenhancement of
the distal ileum. No small bowel dilatation.

--Colon: No focal abnormality.

--Appendix: Normal.

VASCULAR/LYMPHATIC: Normal course and caliber of the major abdominal
vessels. No abdominal or pelvic lymphadenopathy.

REPRODUCTIVE: Normal uterus and ovaries.

MUSCULOSKELETAL. No bony spinal canal stenosis or focal osseous
abnormality.

OTHER: None.
IMPRESSION: 1. Mild wall thickening and hyperenhancement of the distal ileum is
consistent with infectious or inflammatory enteritis. No small bowel
obstruction.
2. Cholelithiasis without other evidence of acute cholecystitis.
